# Patient Record
Sex: Male | Born: 1990 | Race: Black or African American | Hispanic: No | Marital: Single | Smoking: Former smoker
Health system: Southern US, Community
[De-identification: ages and names within clinical notes are randomized; demographics above are authoritative.]

## PROBLEM LIST (undated history)

## (undated) DIAGNOSIS — E669 Obesity, unspecified: Secondary | ICD-10-CM

## (undated) HISTORY — DX: Obesity, unspecified: E66.9

---

## 2015-08-01 ENCOUNTER — Emergency Department (HOSPITAL_COMMUNITY)
Admission: EM | Admit: 2015-08-01 | Discharge: 2015-08-01 | Disposition: A | Discharge status: Home / Self Care | Payer: BLUE CROSS/BLUE SHIELD | Attending: Emergency Medicine | Admitting: Emergency Medicine

## 2015-08-01 ENCOUNTER — Emergency Department (HOSPITAL_COMMUNITY): Payer: BLUE CROSS/BLUE SHIELD

## 2015-08-01 ENCOUNTER — Encounter (HOSPITAL_COMMUNITY): Payer: Self-pay

## 2015-08-01 DIAGNOSIS — M546 Pain in thoracic spine: Secondary | ICD-10-CM | POA: Diagnosis present

## 2015-08-01 DIAGNOSIS — M62830 Muscle spasm of back: Secondary | ICD-10-CM | POA: Insufficient documentation

## 2015-08-01 DIAGNOSIS — Z87891 Personal history of nicotine dependence: Secondary | ICD-10-CM | POA: Diagnosis not present

## 2015-08-01 MED ORDER — IBUPROFEN 200 MG PO TABS
600.0000 mg | ORAL_TABLET | Freq: Once | ORAL | Status: AC
  Administered 2015-08-01: 600 mg via ORAL
  Filled 2015-08-01: qty 3

## 2015-08-01 MED ORDER — METHOCARBAMOL 500 MG PO TABS: 500.0000 mg | ORAL_TABLET | Freq: Three times a day (TID) | ORAL | Status: DC | PRN

## 2015-08-01 MED ORDER — NAPROXEN 500 MG PO TABS: 500.0000 mg | ORAL_TABLET | Freq: Two times a day (BID) | ORAL | Status: DC

## 2015-08-01 MED ORDER — DIAZEPAM 5 MG PO TABS
5.0000 mg | ORAL_TABLET | Freq: Once | ORAL | Status: AC
  Administered 2015-08-01: 5 mg via ORAL
  Filled 2015-08-01: qty 1

## 2015-08-01 MED ORDER — HYDROCODONE-ACETAMINOPHEN 5-325 MG PO TABS
2.0000 | ORAL_TABLET | Freq: Once | ORAL | Status: AC
  Administered 2015-08-01: 2 via ORAL
  Filled 2015-08-01: qty 2

## 2015-08-01 NOTE — Discharge Instructions (Signed)
Please see the sports medicine doctor in 7-14 days. Take the meds prescribed. Ice and massage the area of pain. Xrays show no fractures.  Return to the ER if there is new numbness, weakness, urinary incontinence, urinary retention, tingling in the groin/scrotal region.   Back Injury Prevention Back injuries can be very painful. They can also be difficult to heal. After having one back injury, you are more likely to injure your back again. It is important to learn how to avoid injuring or re-injuring your back. The following tips can help you to prevent a back injury. WHAT SHOULD I KNOW ABOUT PHYSICAL FITNESS?  Exercise for 30 minutes per day on most days of the week or as directed by your health care provider. Make sure to:  Do aerobic exercises, such as walking, jogging, biking, or swimming.  Do exercises that increase balance and strength, such as tai chi and yoga. These can decrease your risk of falling and injuring your back.  Do stretching exercises to help with flexibility.  Try to develop strong abdominal muscles. Your abdominal muscles provide a lot of the support that is needed by your back.  Maintain a healthy weight. This helps to decrease your risk of a back injury. WHAT SHOULD I KNOW ABOUT MY DIET?  Talk with your health care provider about your overall diet. Take supplements and vitamins only as directed by your health care provider.  Talk with your health care provider about how much calcium and vitamin D you need each day. These nutrients help to prevent weakening of the bones (osteoporosis). Osteoporosis can cause broken (fractured) bones, which lead to back pain.  Include good sources of calcium in your diet, such as dairy products, green leafy vegetables, and products that have had calcium added to them (fortified).  Include good sources of vitamin D in your diet, such as milk and foods that are fortified with vitamin D. WHAT SHOULD I KNOW ABOUT MY POSTURE?  Sit  up straight and stand up straight. Avoid leaning forward when you sit or hunching over when you stand.  Choose chairs that have good low-back (lumbar) support.  If you work at a desk, sit close to it so you do not need to lean over. Keep your chin tucked in. Keep your neck drawn back, and keep your elbows bent at a right angle. Your arms should look like the letter "L."  Sit high and close to the steering wheel when you drive. Add a lumbar support to your car seat, if needed.  Avoid sitting or standing in one position for very long. Take breaks to get up, stretch, and walk around at least one time every hour. Take breaks every hour if you are driving for long periods of time.  Sleep on your side with your knees slightly bent, or sleep on your back with a pillow under your knees. Do not lie on the front of your body to sleep. WHAT SHOULD I KNOW ABOUT LIFTING, TWISTING, AND REACHING? Lifting and Heavy Lifting  Avoid heavy lifting, especially repetitive heavy lifting. If you must do heavy lifting:  Stretch before lifting.  Work slowly.  Rest between lifts.  Use a tool such as a cart or a dolly to move objects if one is available.  Make several small trips instead of carrying one heavy load.  Ask for help when you need it, especially when moving big objects.  Follow these steps when lifting:  Stand with your feet shoulder-width apart.  Get as  close to the object as you can. Do not try to pick up a heavy object that is far from your body.  Use handles or lifting straps if they are available.  Bend at your knees. Squat down, but keep your heels off the floor.  Keep your shoulders pulled back, your chin tucked in, and your back straight.  Lift the object slowly while you tighten the muscles in your legs, abdomen, and buttocks. Keep the object as close to the center of your body as possible.  Follow these steps when putting down a heavy load:  Stand with your feet shoulder-width  apart.  Lower the object slowly while you tighten the muscles in your legs, abdomen, and buttocks. Keep the object as close to the center of your body as possible.  Keep your shoulders pulled back, your chin tucked in, and your back straight.  Bend at your knees. Squat down, but keep your heels off the floor.  Use handles or lifting straps if they are available. Twisting and Reaching 1. Avoid lifting heavy objects above your waist. 2. Do not twist at your waist while you are lifting or carrying a load. If you need to turn, move your feet. 3. Do not bend over without bending at your knees. 4. Avoid reaching over your head, across a table, or for an object on a high surface. WHAT ARE SOME OTHER TIPS? 1. Avoid wet floors and icy ground. Keep sidewalks clear of ice to prevent falls. 2. Do not sleep on a mattress that is too soft or too hard. 3. Keep items that are used frequently within easy reach. 4. Put heavier objects on shelves at waist level, and put lighter objects on lower or higher shelves. 5. Find ways to decrease your stress, such as exercise, massage, or relaxation techniques. Stress can build up in your muscles. Tense muscles are more vulnerable to injury. 6. Talk with your health care provider if you feel anxious or depressed. These conditions can make back pain worse. 7. Wear flat heel shoes with cushioned soles. 8. Avoid sudden movements. 9. Use both shoulder straps when carrying a backpack. 10. Do not use any tobacco products, including cigarettes, chewing tobacco, or electronic cigarettes. If you need help quitting, ask your health care provider.   This information is not intended to replace advice given to you by your health care provider. Make sure you discuss any questions you have with your health care provider.   Document Released: 02/14/2004 Document Revised: 05/23/2014 Document Reviewed: 01/10/2014 Elsevier Interactive Patient Education 2016 [employer]  Back  Exercises The following exercises strengthen the muscles that help to support the back. They also help to keep the lower back flexible. Doing these exercises can help to prevent back pain or lessen existing pain. If you have back pain or discomfort, try doing these exercises 2-3 times each day or as told by your health care provider. When the pain goes away, do them once each day, but increase the number of times that you repeat the steps for each exercise (do more repetitions). If you do not have back pain or discomfort, do these exercises once each day or as told by your health care provider. EXERCISES Single Knee to Chest Repeat these steps 3-5 times for each leg:  Lie on your back on a firm bed or the floor with your legs extended.  Bring one knee to your chest. Your other leg should stay extended and in contact with the floor.  Hold  your knee in place by grabbing your knee or thigh.  Pull on your knee until you feel a gentle stretch in your lower back.  Hold the stretch for 10-30 seconds.  Slowly release and straighten your leg. Pelvic Tilt Repeat these steps 5-10 times:  Lie on your back on a firm bed or the floor with your legs extended.  Bend your knees so they are pointing toward the ceiling and your feet are flat on the floor.  Tighten your lower abdominal muscles to press your lower back against the floor. This motion will tilt your pelvis so your tailbone points up toward the ceiling instead of pointing to your feet or the floor.  With gentle tension and even breathing, hold this position for 5-10 seconds. Cat-Cow Repeat these steps until your lower back becomes more flexible:  Get into a hands-and-knees position on a firm surface. Keep your hands under your shoulders, and keep your knees under your hips. You may place padding under your knees for comfort.  Let your head hang down, and point your tailbone toward the floor so your lower back becomes rounded like the back  of a cat.  Hold this position for 5 seconds.  Slowly lift your head and point your tailbone up toward the ceiling so your back forms a sagging arch like the back of a cow.  Hold this position for 5 seconds. Press-Ups Repeat these steps 5-10 times:  Lie on your abdomen (face-down) on the floor.  Place your palms near your head, about shoulder-width apart.  While you keep your back as relaxed as possible and keep your hips on the floor, slowly straighten your arms to raise the top half of your body and lift your shoulders. Do not use your back muscles to raise your upper torso. You may adjust the placement of your hands to make yourself more comfortable.  Hold this position for 5 seconds while you keep your back relaxed.  Slowly return to lying flat on the floor. Bridges Repeat these steps 10 times: 5. Lie on your back on a firm surface. 6. Bend your knees so they are pointing toward the ceiling and your feet are flat on the floor. 7. Tighten your buttocks muscles and lift your buttocks off of the floor until your waist is at almost the same height as your knees. You should feel the muscles working in your buttocks and the back of your thighs. If you do not feel these muscles, slide your feet 1-2 inches farther away from your buttocks. 8. Hold this position for 3-5 seconds. 9. Slowly lower your hips to the starting position, and allow your buttocks muscles to relax completely. If this exercise is too easy, try doing it with your arms crossed over your chest. Abdominal Crunches Repeat these steps 5-10 times: 11. Lie on your back on a firm bed or the floor with your legs extended. 12. Bend your knees so they are pointing toward the ceiling and your feet are flat on the floor. 13. Cross your arms over your chest. 14. Tip your chin slightly toward your chest without bending your neck. 80. Tighten your abdominal muscles and slowly raise your trunk (torso) high enough to lift your shoulder  blades a tiny bit off of the floor. Avoid raising your torso higher than that, because it can put too much stress on your low back and it does not help to strengthen your abdominal muscles. 16. Slowly return to your starting position. Back Lifts Repeat  these steps 5-10 times: 1. Lie on your abdomen (face-down) with your arms at your sides, and rest your forehead on the floor. 2. Tighten the muscles in your legs and your buttocks. 3. Slowly lift your chest off of the floor while you keep your hips pressed to the floor. Keep the back of your head in line with the curve in your back. Your eyes should be looking at the floor. 4. Hold this position for 3-5 seconds. 5. Slowly return to your starting position. SEEK MEDICAL CARE IF:  Your back pain or discomfort gets much worse when you do an exercise.  Your back pain or discomfort does not lessen within 2 hours after you exercise. If you have any of these problems, stop doing these exercises right away. Do not do them again unless your health care provider says that you can. SEEK IMMEDIATE MEDICAL CARE IF:  You develop sudden, severe back pain. If this happens, stop doing the exercises right away. Do not do them again unless your health care provider says that you can.   This information is not intended to replace advice given to you by your health care provider. Make sure you discuss any questions you have with your health care provider.   Document Released: 02/14/2004 Document Revised: 09/27/2014 Document Reviewed: 03/02/2014 Elsevier Interactive Patient Education 2016 Else[employer] Inc.  Back Pain, Adult Back pain is very common in adults.The cause of back pain is rarely dangerous and the pain often gets better over time.The cause of your back pain may not be known. Some common causes of back pain include:  Strain of the muscles or ligaments supporting the spine.  Wear and tear (degeneration) of the spinal disks.  Arthritis.  Direct injury  to the back. For many people, back pain may return. Since back pain is rarely dangerous, most people can learn to manage this condition on their own. HOME CARE INSTRUCTIONS Watch your back pain for any changes. The following actions may help to lessen any discomfort you are feeling:  Remain active. It is stressful on your back to sit or stand in one place for long periods of time. Do not sit, drive, or stand in one place for more than 30 minutes at a time. Take short walks on even surfaces as soon as you are able.Try to increase the length of time you walk each day.  Exercise regularly as directed by your health care provider. Exercise helps your back heal faster. It also helps avoid future injury by keeping your muscles strong and flexible.  Do not stay in bed.Resting more than 1-2 days can delay your recovery.  Pay attention to your body when you bend and lift. The most comfortable positions are those that put less stress on your recovering back. Always use proper lifting techniques, including:  Bending your knees.  Keeping the load close to your body.  Avoiding twisting.  Find a comfortable position to sleep. Use a firm mattress and lie on your side with your knees slightly bent. If you lie on your back, put a pillow under your knees.  Avoid feeling anxious or stressed.Stress increases muscle tension and can worsen back pain.It is important to recognize when you are anxious or stressed and learn ways to manage it, such as with exercise.  Take medicines only as directed by your health care provider. Over-the-counter medicines to reduce pain and inflammation are often the most helpful.Your health care provider may prescribe muscle relaxant drugs.These medicines help dull your pain so  you can more quickly return to your normal activities and healthy exercise.  Apply ice to the injured area:  Put ice in a plastic bag.  Place a towel between your skin and the bag.  Leave the ice on  for 20 minutes, 2-3 times a day for the first 2-3 days. After that, ice and heat may be alternated to reduce pain and spasms.  Maintain a healthy weight. Excess weight puts extra stress on your back and makes it difficult to maintain good posture. SEEK MEDICAL CARE IF:  You have pain that is not relieved with rest or medicine.  You have increasing pain going down into the legs or buttocks.  You have pain that does not improve in one week.  You have night pain.  You lose weight.  You have a fever or chills. SEEK IMMEDIATE MEDICAL CARE IF:   You develop new bowel or bladder control problems.  You have unusual weakness or numbness in your arms or legs.  You develop nausea or vomiting.  You develop abdominal pain.  You feel faint.   This information is not intended to replace advice given to you by your health care provider. Make sure you discuss any questions you have with your health care provider.   Document Released: 01/06/2005 Document Revised: 01/27/2014 Document Reviewed: 05/10/2013 Elsevier Interactive Patient Education [employer].

## 2015-08-01 NOTE — ED Notes (Signed)
Pt c/o of generalized back pain. Pt moving boxes and heavy suit cases yesterday. Denies loss of bowel or bladder. Denies numbness and tingling.

## 2015-08-04 NOTE — ED Provider Notes (Signed)
CSN: [id number]     Arrival date & time 08/01/15  0820 History   First MD Initiated Contact with Patient 08/01/15 1115     Chief Complaint  Patient presents with  . Back Pain     (Consider location/radiation/quality/duration/timing/severity/associated sxs/prior Treatment) Patient is a 25 y.o. male presenting with back pain. The history is provided by the patient.  Back Pain Location:  Thoracic spine Quality:  Aching, stabbing and stiffness Radiates to:  Does not radiate Pain severity:  Moderate Pain is:  Same all the time Onset quality:  Sudden Duration:  1 day Timing:  Constant Progression:  Worsening Chronicity:  New Context: lifting heavy objects   Relieved by:  Being still Worsened by:  Bending and movement Associated symptoms: no dysuria, no fever, no leg pain, no numbness, no paresthesias, no perianal numbness, no tingling and no weakness     History reviewed. No pertinent past medical history. History reviewed. No pertinent past surgical history. No family history on file. Social History  Substance Use Topics  . Smoking status: Former Games developermoker  . Smokeless tobacco: None  . Alcohol Use: Yes    Review of Systems  Constitutional: Negative for fever.  Genitourinary: Negative for dysuria.  Musculoskeletal: Positive for back pain.  Neurological: Negative for tingling, weakness, numbness and paresthesias.  All other systems reviewed and are negative.     Allergies  Review of patient's allergies indicates no known allergies.  Home Medications   Prior to Admission medications   Medication Sig Start Date End Date Taking? Authorizing Provider  methocarbamol (ROBAXIN) 500 MG tablet Take 1 tablet (500 mg total) by mouth every 8 (eight) hours as needed for muscle spasms. 08/01/15   Derwood KaplanAnkit Irem Stoneham, MD  naproxen (NAPROSYN) 500 MG tablet Take 1 tablet (500 mg total) by mouth 2 (two) times daily with a meal. 08/01/15   Nealy Hickmon, MD   BP 128/81 mmHg  Pulse 70   Temp(Src) 98 F (36.7 C) (Oral)  Resp 17  SpO2 96% Physical Exam  Constitutional: He is oriented to person, place, and time. He appears well-developed.  HENT:  Head: Normocephalic and atraumatic.  Eyes: Conjunctivae and EOM are normal. Pupils are equal, round, and reactive to light.  Neck: Normal range of motion. Neck supple.  Cardiovascular: Normal rate, regular rhythm and normal heart sounds.   Pulmonary/Chest: Effort normal and breath sounds normal. No respiratory distress. He has no wheezes.  Abdominal: Soft. Bowel sounds are normal. He exhibits no distension. There is no tenderness. There is no rebound and no guarding.  Musculoskeletal:  Pt has tenderness over the thoracic region No step offs, no erythema. Pt has 2+ patellar reflex bilaterally. Able to discriminate between sharp and dull. Able to ambulate   Neurological: He is alert and oriented to person, place, and time.  Skin: Skin is warm.  Nursing note and vitals reviewed.   ED Course  Procedures (including critical care time) Labs Review Labs Reviewed - No data to display  Imaging Review No results found. I have personally reviewed and evaluated these images and lab results as part of my medical decision-making.   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 Tx with NSAIDs.     Derwood KaplanAnkit Zamari Bonsall, MD 08/04/15 [phone]

## 2015-09-20 ENCOUNTER — Ambulatory Visit: Payer: BLUE CROSS/BLUE SHIELD | Admitting: Nurse Practitioner

## 2015-10-04 ENCOUNTER — Ambulatory Visit: Payer: BLUE CROSS/BLUE SHIELD | Admitting: Nurse Practitioner

## 2017-08-06 ENCOUNTER — Ambulatory Visit: Payer: BLUE CROSS/BLUE SHIELD | Admitting: Adult Health

## 2017-08-06 DIAGNOSIS — Z0289 Encounter for other administrative examinations: Secondary | ICD-10-CM

## 2017-08-12 ENCOUNTER — Ambulatory Visit (INDEPENDENT_AMBULATORY_CARE_PROVIDER_SITE_OTHER): Payer: BLUE CROSS/BLUE SHIELD | Admitting: Adult Health

## 2017-08-12 ENCOUNTER — Encounter: Payer: Self-pay | Admitting: Adult Health

## 2017-08-12 ENCOUNTER — Other Ambulatory Visit (HOSPITAL_COMMUNITY)
Admission: RE | Admit: 2017-08-12 | Discharge: 2017-08-12 | Disposition: A | Discharge status: Home / Self Care | Payer: BLUE CROSS/BLUE SHIELD | Source: Ambulatory Visit | Attending: Adult Health | Admitting: Adult Health

## 2017-08-12 VITALS — BP 120/90 | Temp 98.3°F | Wt 224.0 lb

## 2017-08-12 DIAGNOSIS — E668 Other obesity: Secondary | ICD-10-CM

## 2017-08-12 DIAGNOSIS — L309 Dermatitis, unspecified: Secondary | ICD-10-CM

## 2017-08-12 DIAGNOSIS — Z0000 Encounter for general adult medical examination without abnormal findings: Secondary | ICD-10-CM | POA: Insufficient documentation

## 2017-08-12 DIAGNOSIS — Z113 Encounter for screening for infections with a predominantly sexual mode of transmission: Secondary | ICD-10-CM | POA: Diagnosis not present

## 2017-08-12 LAB — HEPATIC FUNCTION PANEL
ALT: 17 U/L (ref 0–53)
AST: 18 U/L (ref 0–37)
Albumin: 4.6 g/dL (ref 3.5–5.2)
Alkaline Phosphatase: 81 U/L (ref 39–117)
BILIRUBIN DIRECT: 0.2 mg/dL (ref 0.0–0.3)
BILIRUBIN TOTAL: 1 mg/dL (ref 0.2–1.2)
TOTAL PROTEIN: 8.3 g/dL (ref 6.0–8.3)

## 2017-08-12 LAB — CBC WITH DIFFERENTIAL/PLATELET
BASOS ABS: 0.1 10*3/uL (ref 0.0–0.1)
Basophils Relative: 1.1 % (ref 0.0–3.0)
EOS ABS: 0.4 10*3/uL (ref 0.0–0.7)
Eosinophils Relative: 5.1 % — ABNORMAL HIGH (ref 0.0–5.0)
HCT: 48.7 % (ref 39.0–52.0)
Hemoglobin: 16.3 g/dL (ref 13.0–17.0)
LYMPHS ABS: 2 10*3/uL (ref 0.7–4.0)
Lymphocytes Relative: 25.6 % (ref 12.0–46.0)
MCHC: 33.4 g/dL (ref 30.0–36.0)
MCV: 86.6 fl (ref 78.0–100.0)
MONO ABS: 0.6 10*3/uL (ref 0.1–1.0)
Monocytes Relative: 7.5 % (ref 3.0–12.0)
NEUTROS ABS: 4.8 10*3/uL (ref 1.4–7.7)
NEUTROS PCT: 60.7 % (ref 43.0–77.0)
PLATELETS: 248 10*3/uL (ref 150.0–400.0)
RBC: 5.62 Mil/uL (ref 4.22–5.81)
RDW: 12.9 % (ref 11.5–15.5)
WBC: 7.8 10*3/uL (ref 4.0–10.5)

## 2017-08-12 LAB — BASIC METABOLIC PANEL
BUN: 8 mg/dL (ref 6–23)
CALCIUM: 10.1 mg/dL (ref 8.4–10.5)
CO2: 28 meq/L (ref 19–32)
CREATININE: 1.11 mg/dL (ref 0.40–1.50)
Chloride: 103 mEq/L (ref 96–112)
GFR: 104.07 mL/min (ref >60.00)
GLUCOSE: 79 mg/dL (ref 70–99)
Potassium: 4.3 mEq/L (ref 3.5–5.1)
Sodium: 140 mEq/L (ref 135–145)

## 2017-08-12 LAB — LIPID PANEL
CHOL/HDL RATIO: 4
Cholesterol: 172 mg/dL (ref 0–200)
HDL: 39.9 mg/dL (ref >39.00)
LDL Cholesterol: 121 mg/dL — ABNORMAL HIGH (ref 0–99)
NonHDL: 131.67
TRIGLYCERIDES: 51 mg/dL (ref 0.0–149.0)
VLDL: 10.2 mg/dL (ref 0.0–40.0)

## 2017-08-12 LAB — TSH: TSH: 1.01 u[IU]/mL (ref 0.35–4.50)

## 2017-08-12 LAB — HEMOGLOBIN A1C: HEMOGLOBIN A1C: 5.5 % (ref 4.6–6.5)

## 2017-08-12 MED ORDER — TRIAMCINOLONE ACETONIDE 0.5 % EX OINT: 1.0000 "application " | TOPICAL_OINTMENT | Freq: Two times a day (BID) | CUTANEOUS | 3 refills | Status: DC

## 2017-08-12 NOTE — Patient Instructions (Signed)
It was great meeting you today   I will follow up with you regarding your labs   Someone from the weight loss clinic will call you to schedule your appointment   I have sent in a steroid cream for you to use on the skin condition

## 2017-08-12 NOTE — Progress Notes (Signed)
Patient presents to clinic today to establish care. He is a pleasant 27 year old male who  has no past medical history on file.   Acute Concerns: Establish Care   Rash -has a itchy rash on his left hand and left great toe.  Has had this rash for quite some time.  Does not feel as though it is spreading.  He has been applying antifungal cream without resolution  Chronic Issues: Obesity - is interested in weight loss option. He does not eat a heart healthy diet and does not exercise.  He reports eating fast food quite often but has recently cut back on this as he no longer likes the taste.  Also reports drinking sodas throughout the day.  Sounds like his biggest barrier is time, as he has a newborn baby girl.  Health Maintenance: Dental -- Does not do routine care  Vision -- Does not do routine care  Immunizations -- Unknown -   History reviewed. No pertinent past medical history.  History reviewed. No pertinent surgical history.  No current outpatient medications on file prior to visit.   No current facility-administered medications on file prior to visit.     No Known Allergies  Family History  Problem Relation Age of Onset  . Hypertension Mother     Social History   Socioeconomic History  . Marital status: Single    Spouse name: Not on file  . Number of children: Not on file  . Years of education: Not on file  . Highest education level: Not on file  Occupational History  . Not on file  Social Needs  . Financial resource strain: Not on file  . Food insecurity:    Worry: Not on file    Inability: Not on file  . Transportation needs:    Medical: Not on file    Non-medical: Not on file  Tobacco Use  . Smoking status: Former Games developer  . Smokeless tobacco: Never Used  Substance and Sexual Activity  . Alcohol use: Yes    Comment: rarely   . Drug use: Not Currently    Types: Marijuana  . Sexual activity: Not on file  Lifestyle  . Physical activity:    Days  per week: Not on file    Minutes per session: Not on file  . Stress: Not on file  Relationships  . Social connections:    Talks on phone: Not on file    Gets together: Not on file    Attends religious service: Not on file    Active member of club or organization: Not on file    Attends meetings of clubs or organizations: Not on file    Relationship status: Not on file  . Intimate partner violence:    Fear of current or ex partner: Not on file    Emotionally abused: Not on file    Physically abused: Not on file    Forced sexual activity: Not on file  Other Topics Concern  . Not on file  Social History Narrative   Works at [employer]    One child    Not married       Gaming     ROS     BP 120/90   Temp 98.3 F (36.8 C) (Oral)   Wt 224 lb (101.6 kg)   Physical Exam  Constitutional: He is oriented to person, place, and time. He appears well-developed and well-nourished. No distress.  Obese   Eyes: Pupils  are equal, round, and reactive to light. Conjunctivae and EOM are normal. Right eye exhibits no discharge. Left eye exhibits no discharge. No scleral icterus.  Neck: Normal range of motion. Neck supple.  Cardiovascular: Normal rate, regular rhythm, normal heart sounds and intact distal pulses. Exam reveals no gallop and no friction rub.  No murmur heard. Pulmonary/Chest: Effort normal and breath sounds normal. No stridor. No respiratory distress. He has no wheezes. He has no rales. He exhibits no tenderness.  Musculoskeletal: Normal range of motion. He exhibits no edema, tenderness or deformity.  Neurological: He is alert and oriented to person, place, and time. He displays normal reflexes. No cranial nerve deficit or sensory deficit. He exhibits normal muscle tone. Coordination normal.  Skin: Skin is warm and dry. He is not diaphoretic.  Dry, dark, rash noted on left hand and wrists.  Also noted on left great toe.  Appears as eczema. No signs of infection noted    Psychiatric: He has a normal mood and affect. His behavior is normal. Judgment and thought content normal.  Nursing note and vitals reviewed.   Assessment/Plan: 1. Routine general medical examination at a health care facility - Basic metabolic panel - CBC with Differential/Platelet - Hemoglobin A1c - Hepatic function panel - Lipid panel - TSH  2. Screening for STD (sexually transmitted disease)  - HIV antibody - Urine cytology ancillary only  3. Other obesity -We spoke at length about various options and barriers to weight loss.  Ultimately he decided that he would like to see the weight loss clinic.  We will refer him over there, but in the meantime advised to start working on a heart healthy diet and do some form of exercise, even if it is walking at night. - Basic metabolic panel - CBC with Differential/Platelet - Hemoglobin A1c - Hepatic function panel - Lipid panel - TSH - Amb Ref to Medical Weight Management  4. Eczema, unspecified type  - triamcinolone ointment (KENALOG) 0.5 %; Apply 1 application topically 2 (two) times daily.  Dispense: 30 g; Refill: 3  Shirline Freesory Ari Engelbrecht, NP

## 2017-08-13 LAB — HIV ANTIBODY (ROUTINE TESTING W REFLEX): HIV 1&2 Ab, 4th Generation: NONREACTIVE

## 2017-08-13 LAB — URINE CYTOLOGY ANCILLARY ONLY
CHLAMYDIA, DNA PROBE: NEGATIVE
Neisseria Gonorrhea: NEGATIVE
Trichomonas: NEGATIVE

## 2017-08-15 LAB — URINE CYTOLOGY ANCILLARY ONLY
Bacterial vaginitis: NEGATIVE
CANDIDA VAGINITIS: NEGATIVE

## 2017-10-22 ENCOUNTER — Encounter (INDEPENDENT_AMBULATORY_CARE_PROVIDER_SITE_OTHER): Payer: Self-pay

## 2017-10-26 ENCOUNTER — Encounter (INDEPENDENT_AMBULATORY_CARE_PROVIDER_SITE_OTHER): Payer: Self-pay

## 2017-10-26 ENCOUNTER — Ambulatory Visit (INDEPENDENT_AMBULATORY_CARE_PROVIDER_SITE_OTHER): Payer: Self-pay | Admitting: Family Medicine

## 2018-03-08 IMAGING — CR DG THORACIC SPINE 2V
3 series · 3 of 3 positions shown · non-contrast
Comparison: None.

CLINICAL DATA: Right thoracic spine pain for 2 days. Initial
encounter.

EXAM:
THORACIC SPINE 2 VIEWS

[t thoracic spine ap]
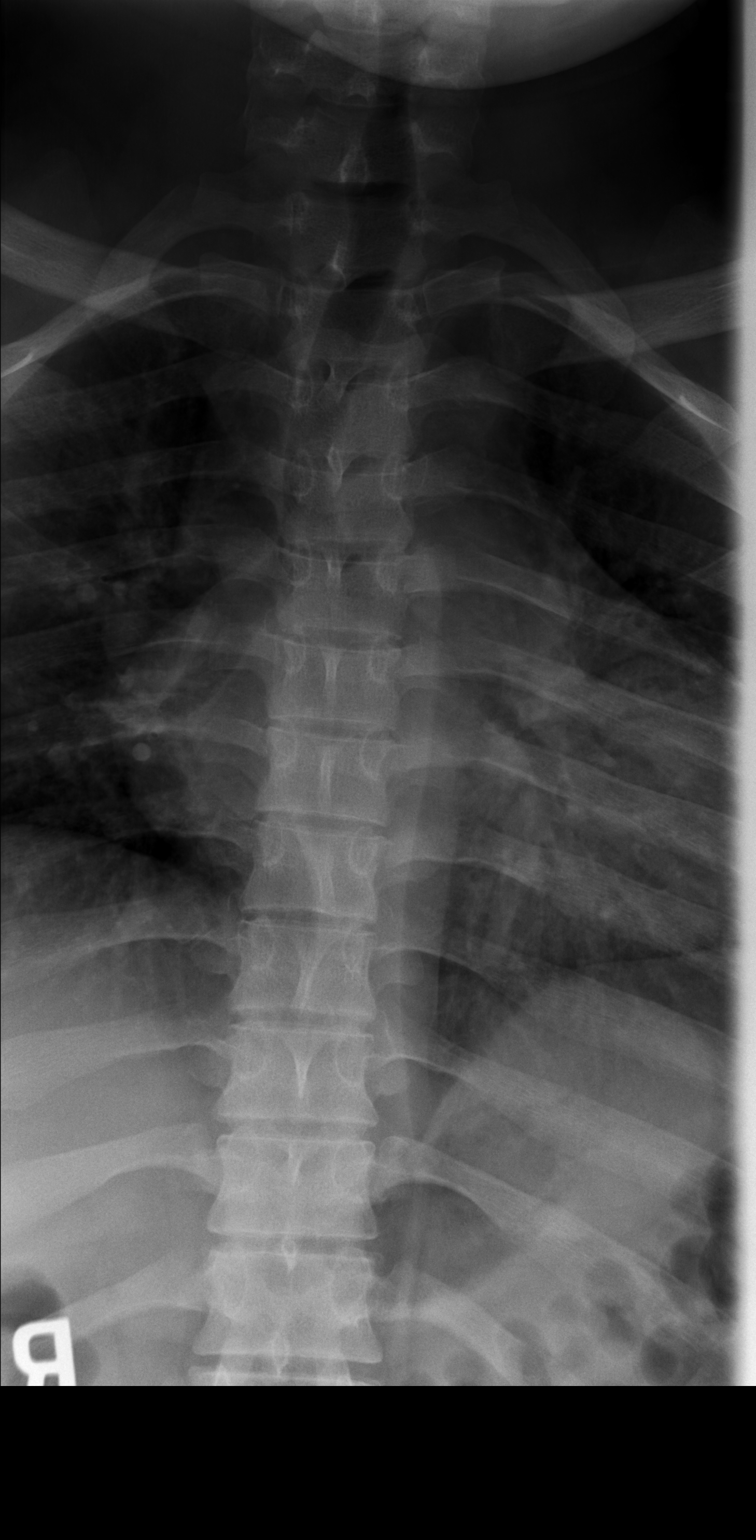

[t thoracic spine lat]
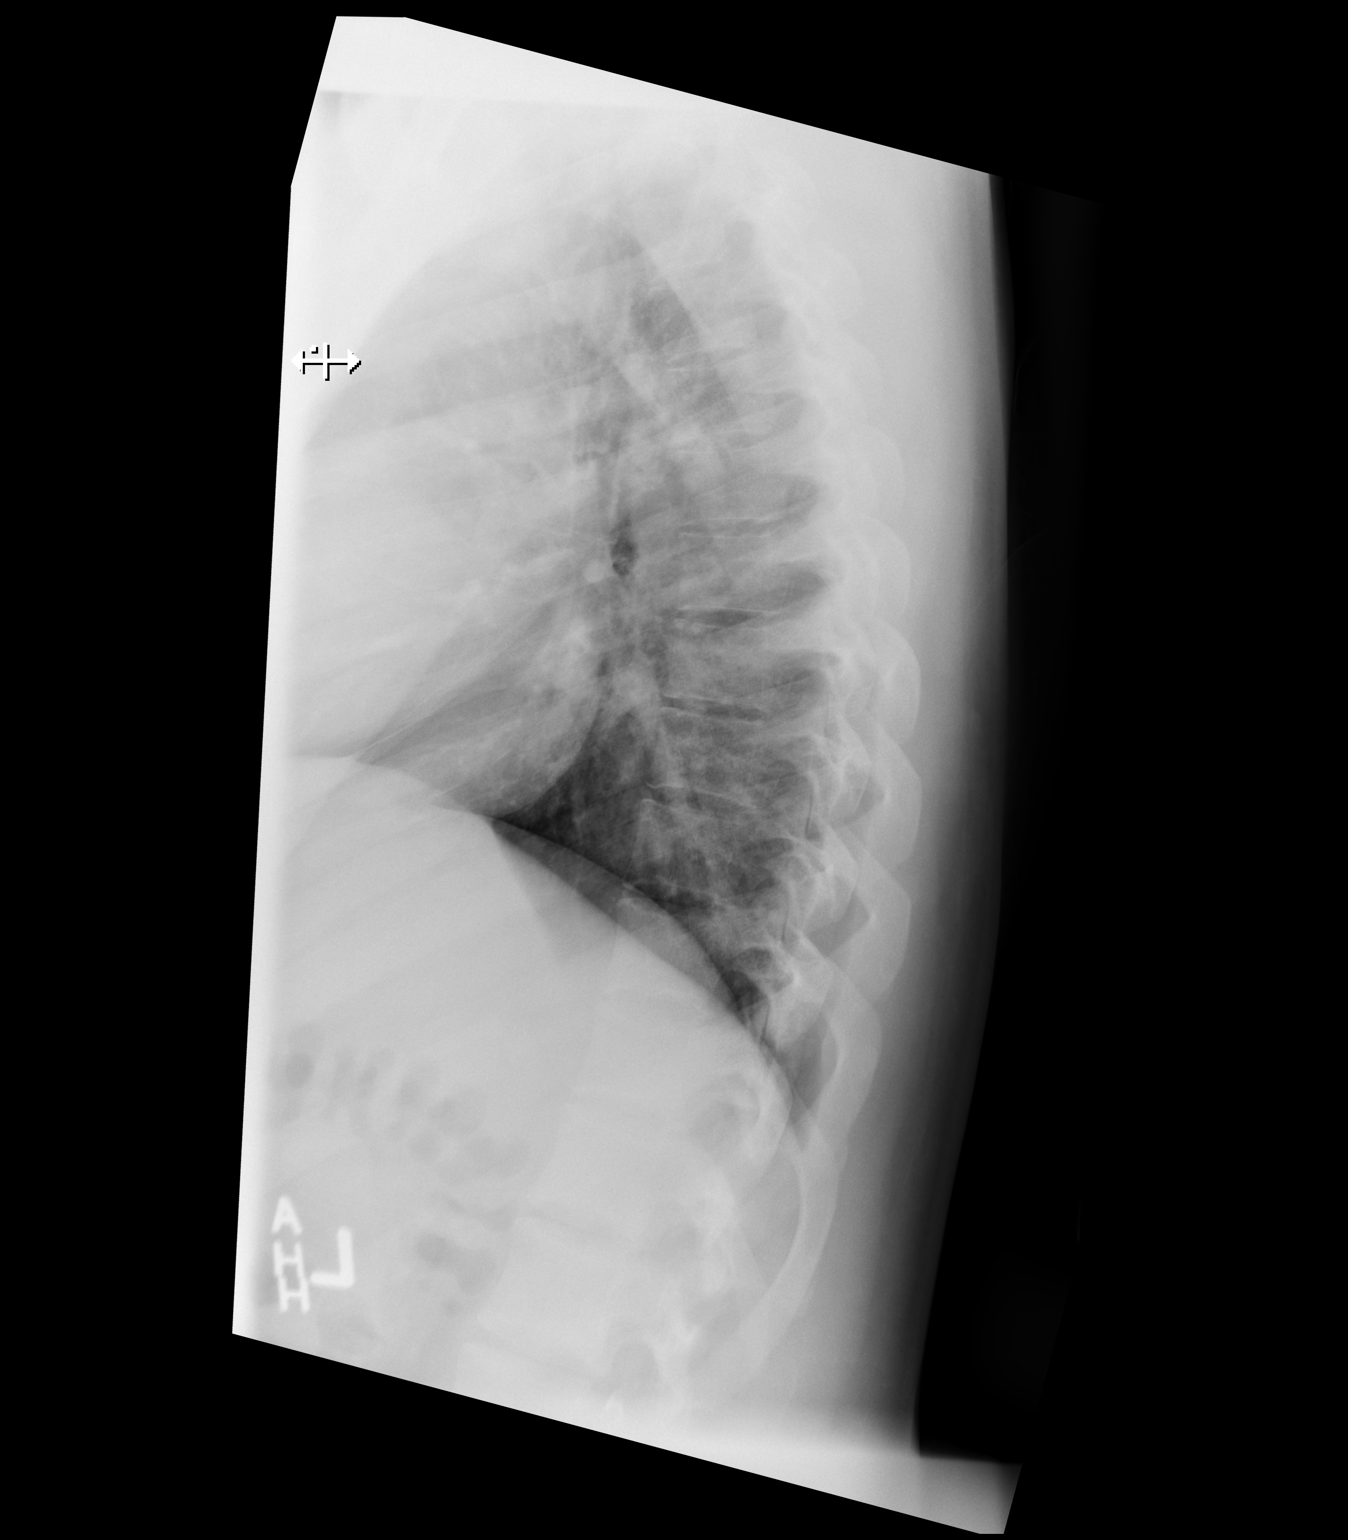

[t thoracic swimmers]
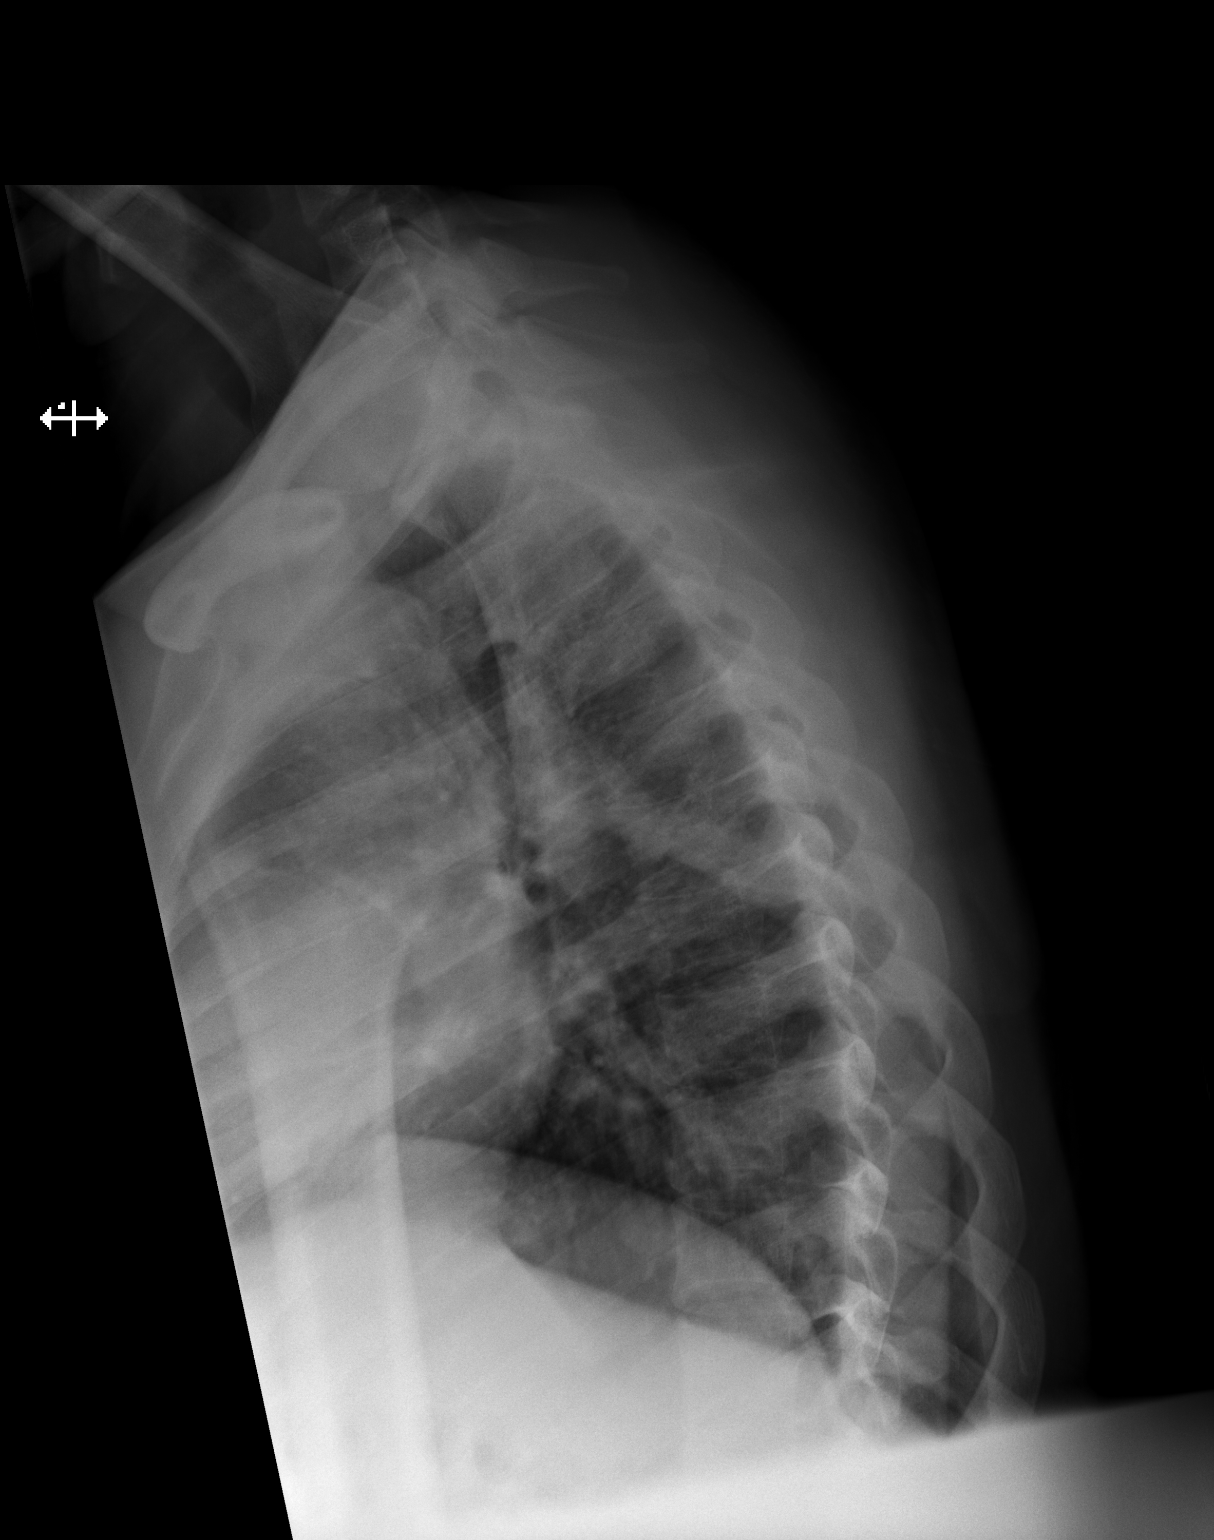

[3 of 3 positions shown; findings below may reference images not displayed]

FINDINGS: There is no evidence of thoracic spine fracture. Alignment is
normal. No other significant bone abnormalities are identified.
IMPRESSION: Negative.

## 2018-05-06 ENCOUNTER — Encounter: Payer: Self-pay | Admitting: Adult Health

## 2018-05-06 ENCOUNTER — Ambulatory Visit (INDEPENDENT_AMBULATORY_CARE_PROVIDER_SITE_OTHER): Payer: BLUE CROSS/BLUE SHIELD | Admitting: Adult Health

## 2018-05-06 ENCOUNTER — Other Ambulatory Visit: Payer: Self-pay

## 2018-05-06 ENCOUNTER — Telehealth: Payer: Self-pay | Admitting: Adult Health

## 2018-05-06 DIAGNOSIS — L309 Dermatitis, unspecified: Secondary | ICD-10-CM

## 2018-05-06 MED ORDER — TRIAMCINOLONE ACETONIDE 0.5 % EX OINT: 1.0000 "application " | TOPICAL_OINTMENT | Freq: Two times a day (BID) | CUTANEOUS | 3 refills | Status: AC

## 2018-05-06 MED ORDER — PREDNISONE 20 MG PO TABS: 20.0000 mg | ORAL_TABLET | Freq: Every day | ORAL | 0 refills | Status: AC

## 2018-05-06 NOTE — Telephone Encounter (Signed)
Copied from CRM [id number]. Topic: Quick Communication - Rx Refill/Question >> May 06, 2018  1:35 PM Crist Infante wrote: Medication: sara with [facility] calling because the instructions they do not understand  predniSONE (DELTASONE) 20 MG tablet 12 tablet 0 05/06/2018   Sig - Route: Take 1 tablet (20 mg total) by mouth daily with breakfast. 40 mg x 4 days then 20 mg x 4 days - Oral  Sent to pharmacy as: predniSONE (DELTASONE) 20 MG tablet  Can you resend with new directions?  [facility] [address], [place] - [address] [phone] (Phone) [phone] (Fax)

## 2018-05-06 NOTE — Telephone Encounter (Signed)
Called [facility] pharmacy to give clarification of Rx instructions.  Attempted to update Medication list in chart to reflect correction - Epic would not allow the change but it is noted that it is being taken differently than prescribed.   Nothing further needed.

## 2018-05-06 NOTE — Progress Notes (Signed)
Virtual Visit via Video Note  I connected with Payton Emerald on 05/06/18 at  1:00 PM EDT by a video enabled telemedicine application and verified that I am speaking with the correct person using two identifiers.  Location patient: home Location provider:work or home office Persons participating in the virtual visit: patient, provider  I discussed the limitations of evaluation and management by telemedicine and the availability of in person appointments. The patient expressed understanding and agreed to proceed.   HPI: 28 year old male who is being evaluated today for chronic issue of eczema.  He was seen last year for this issue when she had a itchy rash on his left hand and left great toe.  He was prescribed a topical cortisone cream which worked well for him.  Reports that starting in January he started having issues with  similar rash again, this time it is spread throughout his body.  He reports multiple small areas of eczema on his arms legs and torso.  He started reusing the eczema cream and reports a slight improvement is running out of medication.  Reports itchiness but no pain or drainage      ROS: See pertinent positives and negatives per HPI.  Past Medical History:  Diagnosis Date  . Obesity     No past surgical history on file.  Family History  Problem Relation Age of Onset  . Hypertension Mother      Current Outpatient Medications:  .  predniSONE (DELTASONE) 20 MG tablet, Take 1 tablet (20 mg total) by mouth daily with breakfast. 40 mg x 4 days then 20 mg x 4 days, Disp: 12 tablet, Rfl: 0 .  triamcinolone ointment (KENALOG) 0.5 %, Apply 1 application topically 2 (two) times daily., Disp: 30 g, Rfl: 3  EXAM:  VITALS per patient if applicable:  GENERAL: alert, oriented, appears well and in no acute distress  HEENT: atraumatic, conjunttiva clear, no obvious abnormalities on inspection of external nose and ears  NECK: normal movements of the head and  neck  LUNGS: on inspection no signs of respiratory distress, breathing rate appears normal, no obvious gross SOB, gasping or wheezing  CV: no obvious cyanosis  MS: moves all visible extremities without noticeable abnormality  PSYCH/NEURO: pleasant and cooperative, no obvious depression or anxiety, speech and thought processing grossly intact  SKIN: Has multiple areas throughout his body which appear as eczema.    ASSESSMENT AND PLAN:  Discussed the following assessment and plan:  Eczema, unspecified type - Plan: predniSONE (DELTASONE) 20 MG tablet, triamcinolone ointment (KENALOG) 0.5 %, Ambulatory referral to Dermatology     I discussed the assessment and treatment plan with the patient. The patient was provided an opportunity to ask questions and all were answered. The patient agreed with the plan and demonstrated an understanding of the instructions.   The patient was advised to call back or seek an in-person evaluation if the symptoms worsen or if the condition fails to improve as anticipated.   Shirline Frees, NP

## 2018-05-06 NOTE — Telephone Encounter (Signed)
yes

## 2018-05-06 NOTE — Telephone Encounter (Signed)
Wayne Gaines, Just to clarify that you are wanting the taper dose? 40mg  x 4 days then 20mg  x 4 days
